# Patient Record
Sex: Female | Born: 1995 | Race: White | Hispanic: No | Marital: Single | Smoking: Current every day smoker
Health system: Southern US, Community
[De-identification: ages and names within clinical notes are randomized; demographics above are authoritative.]

## PROBLEM LIST (undated history)

## (undated) DIAGNOSIS — N83209 Unspecified ovarian cyst, unspecified side: Secondary | ICD-10-CM

## (undated) DIAGNOSIS — F32A Depression, unspecified: Secondary | ICD-10-CM

## (undated) DIAGNOSIS — F5000 Anorexia nervosa, unspecified: Secondary | ICD-10-CM

## (undated) DIAGNOSIS — F329 Major depressive disorder, single episode, unspecified: Secondary | ICD-10-CM

## (undated) DIAGNOSIS — F419 Anxiety disorder, unspecified: Secondary | ICD-10-CM

## (undated) HISTORY — DX: Anxiety disorder, unspecified: F41.9

## (undated) HISTORY — DX: Depression, unspecified: F32.A

## (undated) HISTORY — DX: Anorexia nervosa, unspecified: F50.00

## (undated) HISTORY — DX: Unspecified ovarian cyst, unspecified side: N83.209

## (undated) HISTORY — DX: Major depressive disorder, single episode, unspecified: F32.9

## (undated) HISTORY — PX: SPINAL FUSION: SHX223

---
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
## 2015-03-18 ENCOUNTER — Encounter: Payer: Self-pay | Admitting: Obstetrics and Gynecology

## 2015-04-22 ENCOUNTER — Ambulatory Visit: Payer: 59 | Admitting: Obstetrics and Gynecology

## 2015-10-01 ENCOUNTER — Telehealth: Payer: Self-pay | Admitting: Obstetrics and Gynecology

## 2015-10-01 NOTE — Telephone Encounter (Signed)
Patient came up in my reminder box regarding a follow up ultrasound to recheck an ovarian cyst.  Patient did not return for this.  She is overdue for an annual exam.   Will you please reach out and have her reschedule her appointment for an annual with me?

## 2015-10-01 NOTE — Telephone Encounter (Signed)
Left message to call Dimonique Bourdeau back at [phone]. 

## 2015-10-03 NOTE — Telephone Encounter (Signed)
Called to speak with patient. Patient mother answered. Advised to return call to [facility] at ([phone]. Patients mother states her daughter no longer lives here or in the state, she will forward the message.

## 2015-10-03 NOTE — Telephone Encounter (Signed)
Ok to close the encounter.

## 2015-11-23 DIAGNOSIS — F17210 Nicotine dependence, cigarettes, uncomplicated: Secondary | ICD-10-CM | POA: Diagnosis not present

## 2015-11-23 DIAGNOSIS — G40909 Epilepsy, unspecified, not intractable, without status epilepticus: Secondary | ICD-10-CM | POA: Diagnosis not present

## 2015-11-23 DIAGNOSIS — R569 Unspecified convulsions: Secondary | ICD-10-CM | POA: Diagnosis not present

## 2016-04-06 DIAGNOSIS — R000 Tachycardia, unspecified: Secondary | ICD-10-CM | POA: Diagnosis not present

## 2016-04-06 DIAGNOSIS — Z5321 Procedure and treatment not carried out due to patient leaving prior to being seen by health care provider: Secondary | ICD-10-CM | POA: Diagnosis not present

## 2016-04-06 DIAGNOSIS — R569 Unspecified convulsions: Secondary | ICD-10-CM | POA: Diagnosis not present

## 2016-04-06 DIAGNOSIS — Z9114 Patient's other noncompliance with medication regimen: Secondary | ICD-10-CM | POA: Diagnosis not present

## 2016-08-09 DIAGNOSIS — F1123 Opioid dependence with withdrawal: Secondary | ICD-10-CM | POA: Diagnosis not present

## 2016-08-12 DIAGNOSIS — F1120 Opioid dependence, uncomplicated: Secondary | ICD-10-CM | POA: Diagnosis not present

## 2016-08-13 DIAGNOSIS — F1120 Opioid dependence, uncomplicated: Secondary | ICD-10-CM | POA: Diagnosis not present

## 2016-08-13 DIAGNOSIS — Z79899 Other long term (current) drug therapy: Secondary | ICD-10-CM | POA: Diagnosis not present

## 2016-08-13 DIAGNOSIS — Z79891 Long term (current) use of opiate analgesic: Secondary | ICD-10-CM | POA: Diagnosis not present

## 2016-08-27 DIAGNOSIS — F1120 Opioid dependence, uncomplicated: Secondary | ICD-10-CM | POA: Diagnosis not present

## 2016-08-27 DIAGNOSIS — Z79899 Other long term (current) drug therapy: Secondary | ICD-10-CM | POA: Diagnosis not present

## 2016-08-27 DIAGNOSIS — Z79891 Long term (current) use of opiate analgesic: Secondary | ICD-10-CM | POA: Diagnosis not present

## 2016-09-10 DIAGNOSIS — Z79899 Other long term (current) drug therapy: Secondary | ICD-10-CM | POA: Diagnosis not present

## 2016-09-10 DIAGNOSIS — F1120 Opioid dependence, uncomplicated: Secondary | ICD-10-CM | POA: Diagnosis not present

## 2016-09-10 DIAGNOSIS — Z79891 Long term (current) use of opiate analgesic: Secondary | ICD-10-CM | POA: Diagnosis not present

## 2016-10-08 DIAGNOSIS — Z79899 Other long term (current) drug therapy: Secondary | ICD-10-CM | POA: Diagnosis not present

## 2016-10-08 DIAGNOSIS — F1120 Opioid dependence, uncomplicated: Secondary | ICD-10-CM | POA: Diagnosis not present

## 2016-10-08 DIAGNOSIS — Z79891 Long term (current) use of opiate analgesic: Secondary | ICD-10-CM | POA: Diagnosis not present

## 2016-10-15 DIAGNOSIS — F1120 Opioid dependence, uncomplicated: Secondary | ICD-10-CM | POA: Diagnosis not present

## 2016-11-18 DIAGNOSIS — Z79891 Long term (current) use of opiate analgesic: Secondary | ICD-10-CM | POA: Diagnosis not present

## 2016-11-18 DIAGNOSIS — F1120 Opioid dependence, uncomplicated: Secondary | ICD-10-CM | POA: Diagnosis not present

## 2016-11-18 DIAGNOSIS — Z79899 Other long term (current) drug therapy: Secondary | ICD-10-CM | POA: Diagnosis not present

## 2016-12-17 DIAGNOSIS — F1120 Opioid dependence, uncomplicated: Secondary | ICD-10-CM | POA: Diagnosis not present

## 2016-12-24 DIAGNOSIS — F1120 Opioid dependence, uncomplicated: Secondary | ICD-10-CM | POA: Diagnosis not present

## 2016-12-24 DIAGNOSIS — Z79899 Other long term (current) drug therapy: Secondary | ICD-10-CM | POA: Diagnosis not present

## 2016-12-24 DIAGNOSIS — Z79891 Long term (current) use of opiate analgesic: Secondary | ICD-10-CM | POA: Diagnosis not present

## 2017-01-10 DIAGNOSIS — G40909 Epilepsy, unspecified, not intractable, without status epilepticus: Secondary | ICD-10-CM | POA: Diagnosis not present

## 2017-01-10 DIAGNOSIS — Z743 Need for continuous supervision: Secondary | ICD-10-CM | POA: Diagnosis not present

## 2017-01-10 DIAGNOSIS — R569 Unspecified convulsions: Secondary | ICD-10-CM | POA: Diagnosis not present

## 2017-01-21 DIAGNOSIS — F1120 Opioid dependence, uncomplicated: Secondary | ICD-10-CM | POA: Diagnosis not present

## 2017-01-21 DIAGNOSIS — Z79899 Other long term (current) drug therapy: Secondary | ICD-10-CM | POA: Diagnosis not present

## 2017-01-21 DIAGNOSIS — Z79891 Long term (current) use of opiate analgesic: Secondary | ICD-10-CM | POA: Diagnosis not present

## 2017-02-24 DIAGNOSIS — R634 Abnormal weight loss: Secondary | ICD-10-CM | POA: Diagnosis not present

## 2017-02-24 DIAGNOSIS — F1120 Opioid dependence, uncomplicated: Secondary | ICD-10-CM | POA: Diagnosis not present

## 2017-02-24 DIAGNOSIS — Z79891 Long term (current) use of opiate analgesic: Secondary | ICD-10-CM | POA: Diagnosis not present

## 2017-02-24 DIAGNOSIS — Z79899 Other long term (current) drug therapy: Secondary | ICD-10-CM | POA: Diagnosis not present

## 2017-02-24 DIAGNOSIS — Z7151 Drug abuse counseling and surveillance of drug abuser: Secondary | ICD-10-CM | POA: Diagnosis not present

## 2017-02-24 DIAGNOSIS — N912 Amenorrhea, unspecified: Secondary | ICD-10-CM | POA: Diagnosis not present

## 2017-03-01 DIAGNOSIS — F1210 Cannabis abuse, uncomplicated: Secondary | ICD-10-CM | POA: Diagnosis not present

## 2017-03-01 DIAGNOSIS — F1120 Opioid dependence, uncomplicated: Secondary | ICD-10-CM | POA: Diagnosis not present

## 2017-03-01 DIAGNOSIS — Z720 Tobacco use: Secondary | ICD-10-CM | POA: Diagnosis not present

## 2017-03-16 DIAGNOSIS — F1120 Opioid dependence, uncomplicated: Secondary | ICD-10-CM | POA: Diagnosis not present

## 2017-03-24 DIAGNOSIS — F1120 Opioid dependence, uncomplicated: Secondary | ICD-10-CM | POA: Diagnosis not present

## 2017-03-25 DIAGNOSIS — R634 Abnormal weight loss: Secondary | ICD-10-CM | POA: Diagnosis not present

## 2017-03-25 DIAGNOSIS — Z720 Tobacco use: Secondary | ICD-10-CM | POA: Diagnosis not present

## 2017-03-25 DIAGNOSIS — Z79899 Other long term (current) drug therapy: Secondary | ICD-10-CM | POA: Diagnosis not present

## 2017-03-25 DIAGNOSIS — Z79891 Long term (current) use of opiate analgesic: Secondary | ICD-10-CM | POA: Diagnosis not present

## 2017-03-25 DIAGNOSIS — F1120 Opioid dependence, uncomplicated: Secondary | ICD-10-CM | POA: Diagnosis not present

## 2017-04-29 DIAGNOSIS — Z79899 Other long term (current) drug therapy: Secondary | ICD-10-CM | POA: Diagnosis not present

## 2017-04-29 DIAGNOSIS — Z7151 Drug abuse counseling and surveillance of drug abuser: Secondary | ICD-10-CM | POA: Diagnosis not present

## 2017-04-29 DIAGNOSIS — F1120 Opioid dependence, uncomplicated: Secondary | ICD-10-CM | POA: Diagnosis not present

## 2017-04-29 DIAGNOSIS — Z79891 Long term (current) use of opiate analgesic: Secondary | ICD-10-CM | POA: Diagnosis not present

## 2017-04-29 DIAGNOSIS — R634 Abnormal weight loss: Secondary | ICD-10-CM | POA: Diagnosis not present

## 2017-05-07 NOTE — Telephone Encounter (Signed)
Closing chart

## 2017-05-27 DIAGNOSIS — Z79891 Long term (current) use of opiate analgesic: Secondary | ICD-10-CM | POA: Diagnosis not present

## 2017-05-27 DIAGNOSIS — Z7151 Drug abuse counseling and surveillance of drug abuser: Secondary | ICD-10-CM | POA: Diagnosis not present

## 2017-05-27 DIAGNOSIS — R634 Abnormal weight loss: Secondary | ICD-10-CM | POA: Diagnosis not present

## 2017-05-27 DIAGNOSIS — Z79899 Other long term (current) drug therapy: Secondary | ICD-10-CM | POA: Diagnosis not present

## 2017-05-27 DIAGNOSIS — F1120 Opioid dependence, uncomplicated: Secondary | ICD-10-CM | POA: Diagnosis not present

## 2017-06-24 DIAGNOSIS — Z79891 Long term (current) use of opiate analgesic: Secondary | ICD-10-CM | POA: Diagnosis not present

## 2017-06-24 DIAGNOSIS — Z79899 Other long term (current) drug therapy: Secondary | ICD-10-CM | POA: Diagnosis not present

## 2017-06-24 DIAGNOSIS — Z7151 Drug abuse counseling and surveillance of drug abuser: Secondary | ICD-10-CM | POA: Diagnosis not present

## 2017-06-24 DIAGNOSIS — F1120 Opioid dependence, uncomplicated: Secondary | ICD-10-CM | POA: Diagnosis not present

## 2017-06-24 DIAGNOSIS — R634 Abnormal weight loss: Secondary | ICD-10-CM | POA: Diagnosis not present

## 2017-07-15 DIAGNOSIS — G4089 Other seizures: Secondary | ICD-10-CM | POA: Diagnosis not present

## 2017-07-15 DIAGNOSIS — R569 Unspecified convulsions: Secondary | ICD-10-CM | POA: Diagnosis not present

## 2017-07-15 DIAGNOSIS — G40909 Epilepsy, unspecified, not intractable, without status epilepticus: Secondary | ICD-10-CM | POA: Diagnosis not present

## 2017-07-15 DIAGNOSIS — R4182 Altered mental status, unspecified: Secondary | ICD-10-CM | POA: Diagnosis not present

## 2017-07-15 DIAGNOSIS — M419 Scoliosis, unspecified: Secondary | ICD-10-CM | POA: Diagnosis not present

## 2017-07-15 DIAGNOSIS — Z743 Need for continuous supervision: Secondary | ICD-10-CM | POA: Diagnosis not present

## 2017-07-15 DIAGNOSIS — F17210 Nicotine dependence, cigarettes, uncomplicated: Secondary | ICD-10-CM | POA: Diagnosis not present

## 2017-07-15 DIAGNOSIS — F1290 Cannabis use, unspecified, uncomplicated: Secondary | ICD-10-CM | POA: Diagnosis not present

## 2017-08-31 DIAGNOSIS — R0600 Dyspnea, unspecified: Secondary | ICD-10-CM | POA: Diagnosis not present

## 2017-08-31 DIAGNOSIS — F17210 Nicotine dependence, cigarettes, uncomplicated: Secondary | ICD-10-CM | POA: Diagnosis not present

## 2017-08-31 DIAGNOSIS — R569 Unspecified convulsions: Secondary | ICD-10-CM | POA: Diagnosis not present

## 2017-08-31 DIAGNOSIS — Z743 Need for continuous supervision: Secondary | ICD-10-CM | POA: Diagnosis not present

## 2017-09-15 DIAGNOSIS — F1120 Opioid dependence, uncomplicated: Secondary | ICD-10-CM | POA: Diagnosis not present

## 2017-09-21 DIAGNOSIS — F1120 Opioid dependence, uncomplicated: Secondary | ICD-10-CM | POA: Diagnosis not present

## 2017-10-20 DIAGNOSIS — R9431 Abnormal electrocardiogram [ECG] [EKG]: Secondary | ICD-10-CM | POA: Diagnosis not present

## 2017-10-20 DIAGNOSIS — F17210 Nicotine dependence, cigarettes, uncomplicated: Secondary | ICD-10-CM | POA: Diagnosis not present

## 2017-10-20 DIAGNOSIS — Z743 Need for continuous supervision: Secondary | ICD-10-CM | POA: Diagnosis not present

## 2017-10-20 DIAGNOSIS — G40909 Epilepsy, unspecified, not intractable, without status epilepticus: Secondary | ICD-10-CM | POA: Diagnosis not present

## 2017-10-20 DIAGNOSIS — R569 Unspecified convulsions: Secondary | ICD-10-CM | POA: Diagnosis not present

## 2017-10-20 DIAGNOSIS — I499 Cardiac arrhythmia, unspecified: Secondary | ICD-10-CM | POA: Diagnosis not present

## 2017-11-01 DIAGNOSIS — F1120 Opioid dependence, uncomplicated: Secondary | ICD-10-CM | POA: Diagnosis not present

## 2017-11-25 DIAGNOSIS — F1120 Opioid dependence, uncomplicated: Secondary | ICD-10-CM | POA: Diagnosis not present

## 2017-12-20 DIAGNOSIS — F4310 Post-traumatic stress disorder, unspecified: Secondary | ICD-10-CM | POA: Diagnosis not present

## 2017-12-20 DIAGNOSIS — F411 Generalized anxiety disorder: Secondary | ICD-10-CM | POA: Diagnosis not present

## 2017-12-23 DIAGNOSIS — F1120 Opioid dependence, uncomplicated: Secondary | ICD-10-CM | POA: Diagnosis not present

## 2018-01-20 DIAGNOSIS — F1120 Opioid dependence, uncomplicated: Secondary | ICD-10-CM | POA: Diagnosis not present

## 2018-01-29 DIAGNOSIS — Z9114 Patient's other noncompliance with medication regimen: Secondary | ICD-10-CM | POA: Diagnosis not present

## 2018-02-08 DIAGNOSIS — R569 Unspecified convulsions: Secondary | ICD-10-CM | POA: Diagnosis not present

## 2018-02-08 DIAGNOSIS — Z9114 Patient's other noncompliance with medication regimen: Secondary | ICD-10-CM | POA: Diagnosis not present

## 2018-02-08 DIAGNOSIS — Z743 Need for continuous supervision: Secondary | ICD-10-CM | POA: Diagnosis not present

## 2018-02-08 DIAGNOSIS — G40909 Epilepsy, unspecified, not intractable, without status epilepticus: Secondary | ICD-10-CM | POA: Diagnosis not present

## 2018-02-08 DIAGNOSIS — R1110 Vomiting, unspecified: Secondary | ICD-10-CM | POA: Diagnosis not present

## 2018-02-08 DIAGNOSIS — Y92009 Unspecified place in unspecified non-institutional (private) residence as the place of occurrence of the external cause: Secondary | ICD-10-CM | POA: Diagnosis not present

## 2018-02-08 DIAGNOSIS — T402X5A Adverse effect of other opioids, initial encounter: Secondary | ICD-10-CM | POA: Diagnosis not present

## 2018-02-08 DIAGNOSIS — F1120 Opioid dependence, uncomplicated: Secondary | ICD-10-CM | POA: Diagnosis not present

## 2018-03-01 DIAGNOSIS — F1120 Opioid dependence, uncomplicated: Secondary | ICD-10-CM | POA: Diagnosis not present

## 2018-03-08 DIAGNOSIS — F1120 Opioid dependence, uncomplicated: Secondary | ICD-10-CM | POA: Diagnosis not present

## 2018-03-15 DIAGNOSIS — F1120 Opioid dependence, uncomplicated: Secondary | ICD-10-CM | POA: Diagnosis not present

## 2018-04-12 DIAGNOSIS — F1120 Opioid dependence, uncomplicated: Secondary | ICD-10-CM | POA: Diagnosis not present

## 2018-04-20 DIAGNOSIS — Z975 Presence of (intrauterine) contraceptive device: Secondary | ICD-10-CM | POA: Diagnosis not present

## 2018-04-20 DIAGNOSIS — R569 Unspecified convulsions: Secondary | ICD-10-CM | POA: Diagnosis not present

## 2018-04-20 DIAGNOSIS — F1111 Opioid abuse, in remission: Secondary | ICD-10-CM | POA: Diagnosis not present

## 2018-04-20 DIAGNOSIS — Z7689 Persons encountering health services in other specified circumstances: Secondary | ICD-10-CM | POA: Diagnosis not present

## 2018-05-04 DIAGNOSIS — F1120 Opioid dependence, uncomplicated: Secondary | ICD-10-CM | POA: Diagnosis not present

## 2018-05-11 DIAGNOSIS — F1120 Opioid dependence, uncomplicated: Secondary | ICD-10-CM | POA: Diagnosis not present

## 2018-06-15 DIAGNOSIS — F1120 Opioid dependence, uncomplicated: Secondary | ICD-10-CM | POA: Diagnosis not present

## 2018-07-13 DIAGNOSIS — F1120 Opioid dependence, uncomplicated: Secondary | ICD-10-CM | POA: Diagnosis not present

## 2018-08-11 DIAGNOSIS — F1120 Opioid dependence, uncomplicated: Secondary | ICD-10-CM | POA: Diagnosis not present

## 2018-09-08 DIAGNOSIS — F1120 Opioid dependence, uncomplicated: Secondary | ICD-10-CM | POA: Diagnosis not present

## 2018-10-12 DIAGNOSIS — F1120 Opioid dependence, uncomplicated: Secondary | ICD-10-CM | POA: Diagnosis not present

## 2018-11-09 DIAGNOSIS — F1120 Opioid dependence, uncomplicated: Secondary | ICD-10-CM | POA: Diagnosis not present

## 2018-12-07 DIAGNOSIS — F1120 Opioid dependence, uncomplicated: Secondary | ICD-10-CM | POA: Diagnosis not present

## 2019-01-11 DIAGNOSIS — F1120 Opioid dependence, uncomplicated: Secondary | ICD-10-CM | POA: Diagnosis not present

## 2019-02-08 DIAGNOSIS — F1120 Opioid dependence, uncomplicated: Secondary | ICD-10-CM | POA: Diagnosis not present

## 2019-02-22 DIAGNOSIS — G4089 Other seizures: Secondary | ICD-10-CM | POA: Diagnosis not present

## 2019-02-22 DIAGNOSIS — G40909 Epilepsy, unspecified, not intractable, without status epilepticus: Secondary | ICD-10-CM | POA: Diagnosis not present

## 2019-02-22 DIAGNOSIS — S00502A Unspecified superficial injury of oral cavity, initial encounter: Secondary | ICD-10-CM | POA: Diagnosis not present

## 2019-03-01 DIAGNOSIS — F1120 Opioid dependence, uncomplicated: Secondary | ICD-10-CM | POA: Diagnosis not present

## 2019-04-10 DIAGNOSIS — F1120 Opioid dependence, uncomplicated: Secondary | ICD-10-CM | POA: Diagnosis not present

## 2019-05-08 DIAGNOSIS — F1120 Opioid dependence, uncomplicated: Secondary | ICD-10-CM | POA: Diagnosis not present

## 2019-05-30 DIAGNOSIS — R569 Unspecified convulsions: Secondary | ICD-10-CM | POA: Diagnosis not present

## 2019-06-21 DIAGNOSIS — F1120 Opioid dependence, uncomplicated: Secondary | ICD-10-CM | POA: Diagnosis not present

## 2019-07-19 DIAGNOSIS — F1120 Opioid dependence, uncomplicated: Secondary | ICD-10-CM | POA: Diagnosis not present

## 2019-08-21 DIAGNOSIS — F1120 Opioid dependence, uncomplicated: Secondary | ICD-10-CM | POA: Diagnosis not present

## 2019-09-18 DIAGNOSIS — F1120 Opioid dependence, uncomplicated: Secondary | ICD-10-CM | POA: Diagnosis not present

## 2019-10-16 DIAGNOSIS — F1120 Opioid dependence, uncomplicated: Secondary | ICD-10-CM | POA: Diagnosis not present

## 2019-11-02 DIAGNOSIS — R569 Unspecified convulsions: Secondary | ICD-10-CM | POA: Diagnosis not present

## 2019-11-02 DIAGNOSIS — Z0000 Encounter for general adult medical examination without abnormal findings: Secondary | ICD-10-CM | POA: Diagnosis not present

## 2019-11-02 DIAGNOSIS — F1111 Opioid abuse, in remission: Secondary | ICD-10-CM | POA: Diagnosis not present

## 2019-11-02 DIAGNOSIS — Z975 Presence of (intrauterine) contraceptive device: Secondary | ICD-10-CM | POA: Diagnosis not present

## 2019-11-13 DIAGNOSIS — F1120 Opioid dependence, uncomplicated: Secondary | ICD-10-CM | POA: Diagnosis not present

## 2019-12-18 DIAGNOSIS — F1120 Opioid dependence, uncomplicated: Secondary | ICD-10-CM | POA: Diagnosis not present

## 2020-01-15 DIAGNOSIS — F1120 Opioid dependence, uncomplicated: Secondary | ICD-10-CM | POA: Diagnosis not present

## 2020-02-19 DIAGNOSIS — F1120 Opioid dependence, uncomplicated: Secondary | ICD-10-CM | POA: Diagnosis not present

## 2020-03-18 DIAGNOSIS — F1120 Opioid dependence, uncomplicated: Secondary | ICD-10-CM | POA: Diagnosis not present

## 2020-04-25 DIAGNOSIS — F1120 Opioid dependence, uncomplicated: Secondary | ICD-10-CM | POA: Diagnosis not present

## 2020-05-23 DIAGNOSIS — F1120 Opioid dependence, uncomplicated: Secondary | ICD-10-CM | POA: Diagnosis not present

## 2020-06-20 DIAGNOSIS — F1120 Opioid dependence, uncomplicated: Secondary | ICD-10-CM | POA: Diagnosis not present

## 2020-08-29 DIAGNOSIS — F1120 Opioid dependence, uncomplicated: Secondary | ICD-10-CM | POA: Diagnosis not present

## 2020-09-26 DIAGNOSIS — F1120 Opioid dependence, uncomplicated: Secondary | ICD-10-CM | POA: Diagnosis not present

## 2020-11-20 DIAGNOSIS — F1120 Opioid dependence, uncomplicated: Secondary | ICD-10-CM | POA: Diagnosis not present

## 2020-12-19 DIAGNOSIS — F1120 Opioid dependence, uncomplicated: Secondary | ICD-10-CM | POA: Diagnosis not present
